# Patient Record
Sex: Male | Born: 1985 | Race: White | Hispanic: No | Marital: Married | Smoking: Never smoker
Health system: Southern US, Community
[De-identification: ages and names within clinical notes are randomized; demographics above are authoritative.]

---

## 2016-02-19 ENCOUNTER — Emergency Department (HOSPITAL_BASED_OUTPATIENT_CLINIC_OR_DEPARTMENT_OTHER): Payer: 59

## 2016-02-19 ENCOUNTER — Encounter (HOSPITAL_BASED_OUTPATIENT_CLINIC_OR_DEPARTMENT_OTHER): Payer: Self-pay | Admitting: Emergency Medicine

## 2016-02-19 ENCOUNTER — Emergency Department (HOSPITAL_BASED_OUTPATIENT_CLINIC_OR_DEPARTMENT_OTHER)
Admission: EM | Admit: 2016-02-19 | Discharge: 2016-02-19 | Disposition: A | Discharge status: Home / Self Care | Payer: 59 | Attending: Emergency Medicine | Admitting: Emergency Medicine

## 2016-02-19 DIAGNOSIS — N201 Calculus of ureter: Secondary | ICD-10-CM | POA: Insufficient documentation

## 2016-02-19 DIAGNOSIS — R109 Unspecified abdominal pain: Secondary | ICD-10-CM | POA: Diagnosis present

## 2016-02-19 DIAGNOSIS — N200 Calculus of kidney: Secondary | ICD-10-CM

## 2016-02-19 LAB — URINE MICROSCOPIC-ADD ON: Squamous Epithelial / LPF: NONE SEEN

## 2016-02-19 LAB — URINALYSIS, ROUTINE W REFLEX MICROSCOPIC
Bilirubin Urine: NEGATIVE
Glucose, UA: NEGATIVE mg/dL
Ketones, ur: NEGATIVE mg/dL
LEUKOCYTES UA: NEGATIVE
NITRITE: NEGATIVE
PROTEIN: 100 mg/dL — AB
Specific Gravity, Urine: 1.023 (ref 1.005–1.030)
pH: 5.5 (ref 5.0–8.0)

## 2016-02-19 LAB — BASIC METABOLIC PANEL
Anion gap: 6 (ref 5–15)
BUN: 21 mg/dL — AB (ref 6–20)
CO2: 26 mmol/L (ref 22–32)
CREATININE: 0.76 mg/dL (ref 0.61–1.24)
Calcium: 9.5 mg/dL (ref 8.9–10.3)
Chloride: 108 mmol/L (ref 101–111)
GFR calc Af Amer: >60 mL/min (ref >60)
Glucose, Bld: 104 mg/dL — ABNORMAL HIGH (ref 65–99)
POTASSIUM: 3.9 mmol/L (ref 3.5–5.1)
SODIUM: 140 mmol/L (ref 135–145)

## 2016-02-19 LAB — CBC
HEMATOCRIT: 44.2 % (ref 39.0–52.0)
Hemoglobin: 15.6 g/dL (ref 13.0–17.0)
MCH: 31.3 pg (ref 26.0–34.0)
MCHC: 35.3 g/dL (ref 30.0–36.0)
MCV: 88.6 fL (ref 78.0–100.0)
PLATELETS: 245 10*3/uL (ref 150–400)
RBC: 4.99 MIL/uL (ref 4.22–5.81)
RDW: 13 % (ref 11.5–15.5)
WBC: 9.1 10*3/uL (ref 4.0–10.5)

## 2016-02-19 MED ORDER — CEPHALEXIN 500 MG PO CAPS: 500.0000 mg | ORAL_CAPSULE | Freq: Two times a day (BID) | ORAL | 0 refills | Status: AC

## 2016-02-19 MED ORDER — HYDROCODONE-ACETAMINOPHEN 5-325 MG PO TABS: 1.0000 | ORAL_TABLET | ORAL | 0 refills | Status: AC | PRN

## 2016-02-19 NOTE — ED Notes (Signed)
Pt transported to CT by technician.

## 2016-02-19 NOTE — ED Triage Notes (Signed)
Pt states burning and discomfort with urination since yesterday, and then awoke with lower left back pain that comes and goes in "excrutiating" waves since.

## 2016-02-19 NOTE — ED Provider Notes (Signed)
a [facility]  [facility]   CSN: [id number] Arrival date & time: 02/19/16  0815     History   Chief Complaint Chief Complaint  Patient presents with  . Back Pain      HPI  Barry Bowen is a 30 y.o. Male  with no significant past medical history present for sudden onset left flank pain this morning. Pain lasted for approximately 30 minutes, with severe nature, was associated with nausea but no emesis. He reports burning with urination that started yesterday. He denies abdominal pain, fevers, penile lesions, purulent drainage from the penis. He is sexually active only with his wife.  Otherwise he denies recent illness, chest pain, SOB, current pain, nausea, emesis, or diarrhea He does note that he drinks up to 8 [employer]eps[employer] sodas per day but has been trying to cut down   History reviewed. No pertinent past medical history.  There are no active problems to display for this patient.   History reviewed. No pertinent surgical history.   Home Medications    Prior to Admission medications   Medication Sig Start Date End Date Taking? Authorizing Provider  cephALEXin (KEFLEX) 500 MG capsule Take 1 capsule (500 mg total) by mouth 2 (two) times daily. 02/19/16   Bonney Aid, MD  HYDROcodone-acetaminophen (NORCO) 5-325 MG tablet Take 1 tablet by mouth every 4 (four) hours as needed for moderate pain. 02/19/16   Bonney Aid, MD    Family History No family history on file.  Social History Social History  Substance Use Topics  . Smoking status: Never Smoker  . Smokeless tobacco: Never Used  . Alcohol use No     Allergies   Review of patient's allergies indicates no known allergies.   Review of Systems Review of Systems  Constitutional: Negative.   HENT: Negative.   Eyes: Negative.   Respiratory: Negative.   Cardiovascular: Negative.   Gastrointestinal: Positive for nausea. Negative for vomiting.  Endocrine: Negative.   Genitourinary: Positive for dysuria  and flank pain. Negative for genital sores and hematuria.  Musculoskeletal:       Left flank pain  Skin: Negative.   Neurological: Negative.      Physical Exam Updated Vital Signs BP 116/86 (BP Location: Right Arm)   Pulse 61   Temp 97.8 F (36.6 C) (Oral)   Resp 16   Ht 5\' 11"  (1.803 m)   Wt 72.6 kg   SpO2 100%   BMI 22.32 kg/m   Physical Exam  Constitutional: He is oriented to person, place, and time. He appears well-developed and well-nourished.  HENT:  Head: Normocephalic and atraumatic.  Eyes: EOM are normal. Pupils are equal, round, and reactive to light.  Neck: Normal range of motion. Neck supple.  Cardiovascular: Normal rate, regular rhythm and normal heart sounds.   No murmur heard. Pulmonary/Chest: Effort normal and breath sounds normal. No respiratory distress.  Abdominal: Soft. Bowel sounds are normal. He exhibits no distension. There is no tenderness.  Musculoskeletal: He exhibits no edema or tenderness.  No flank pain bilaterally  Neurological: He is alert and oriented to person, place, and time.     ED Treatments / Results  Labs (all labs ordered are listed, but only abnormal results are displayed) Labs Reviewed  URINALYSIS, ROUTINE W REFLEX MICROSCOPIC (NOT AT [facility]) - Abnormal; Notable for the following:       Result Value   Color, Urine AMBER (*)    APPearance CLOUDY (*)    Hgb  urine dipstick LARGE (*)    Protein, ur 100 (*)    All other components within normal limits  BASIC METABOLIC PANEL - Abnormal; Notable for the following:    Glucose, Bld 104 (*)    BUN 21 (*)    All other components within normal limits  URINE MICROSCOPIC-ADD ON - Abnormal; Notable for the following:    Bacteria, UA MANY (*)    All other components within normal limits  URINE CULTURE  CBC    EKG  EKG Interpretation None       Radiology Ct Renal Stone Study  Result Date: 02/19/2016 CLINICAL DATA:  Left flank pain and hematuria. Burning with urination.  Symptoms for 1 day. EXAM: CT ABDOMEN AND PELVIS WITHOUT CONTRAST TECHNIQUE: Multidetector CT imaging of the abdomen and pelvis was performed following the standard protocol without IV contrast. COMPARISON:  None. FINDINGS: Lower chest: Lung bases are clear.  Heart normal size. Hepatobiliary: No focal liver abnormality is seen. No gallstones, gallbladder wall thickening, or biliary dilatation. Pancreas: Unremarkable. No pancreatic ductal dilatation or surrounding inflammatory changes. Spleen: Normal in size without focal abnormality. Adrenals/Urinary Tract: No adrenal masses. Mild prominence of the left intrarenal collecting system and left ureter. There is a 2 mm stone in the distal left ureter just above the ureterovesicular junction with associated mild periureteral stranding. No other ureteral stones. No intrarenal stones. No renal masses. Bladder is decompressed but otherwise unremarkable. Stomach/Bowel: Stomach, small bowel and colon are unremarkable. Appendix not definitively seen. No evidence of appendicitis. Vascular/Lymphatic: No significant vascular findings are present. No enlarged abdominal or pelvic lymph nodes. Reproductive: Prostate is unremarkable. Other: No abdominal wall hernia or abnormality. No abdominopelvic ascites. Musculoskeletal: No acute or significant osseous findings. IMPRESSION: 1. 2 mm stone in the distal left ureter leads to mild left renal collecting system in ureteral dilation. 2. No other acute findings. No intrarenal stones. Exam otherwise unremarkable. Electronically Signed   By: Amie Portland M.D.   On: 02/19/2016 09:17    Procedures Procedures (including critical care time)  Medications Ordered in ED Medications - No data to display   Initial Impression / Assessment and Plan / ED Course  I have reviewed the triage vital signs and the nursing notes.  Pertinent labs & imaging results that were available during my care of the patient were reviewed by me and considered  in my medical decision making (see chart for details).  Clinical Course    Uncomplicated ED course  Final Clinical Impressions(s) / ED Diagnoses   Final diagnoses:  Renal stone   30 year old male with no significant past medical history presents with left flank pain. Patient was hemodynamically stable in the emergency department and his pain resolved prior to arriving. On CT evaluation he was noted to have a 2 mm stone. Urinalysis was significant for hemoglobin and bacteria with no leukocytes or nitrites. Urine culture was ordered. Given size of the stone, and his clinical stability the patient was discharged with hydration precautions. He was asked to reduce the amount of soda he has been drinking. He will be continued on keflex x10 days for possible UTI given the bacteria in his urine. Patient counseled to follow with urology if he does not pass the stone the next several days. He will be given Norco 2 if he should have pain when he passes the stone. He was given information for urology to call for follow-up as needed. Patient was discharged from the ED in stable condition. All questions were  answered and patient and his wife expressed their understanding and acceptance of the spine.      New Prescriptions Discharge Medication List as of 02/19/2016  9:58 AM    START taking these medications   Details  cephALEXin (KEFLEX) 500 MG capsule Take 1 capsule (500 mg total) by mouth 2 (two) times daily., Starting Sat 02/19/2016, Print    HYDROcodone-acetaminophen (NORCO) 5-325 MG tablet Take 1 tablet by mouth every 4 (four) hours as needed for moderate pain., Starting Sat 02/19/2016, Print        Kushi Kun A. Kennon RoundsHaney MD, MS Family Medicine Resident PGY-3 Pager [phone]    Bonney AidAlyssa A Reisa Coppola, MD 02/19/16 [phone]    Marily MemosJason Mesner, MD 02/19/16 [phone]

## 2016-02-19 NOTE — Discharge Instructions (Signed)
You have a Kidney stone. Drink plenty of water, reduce the amount of soda that you drink. You may take Norco as needed for pain as the kidney stone passes. Please take antibiotics to ten days as prescribed. If you start to have fevers, worsening pain with urination or do not pass the stone within the next few days, return to the ED or call urolgy.

## 2016-02-20 LAB — URINE CULTURE: CULTURE: NO GROWTH

## 2016-08-27 ENCOUNTER — Emergency Department (HOSPITAL_BASED_OUTPATIENT_CLINIC_OR_DEPARTMENT_OTHER)
Admission: EM | Admit: 2016-08-27 | Discharge: 2016-08-27 | Disposition: A | Discharge status: Home / Self Care | Payer: 59 | Attending: Emergency Medicine | Admitting: Emergency Medicine

## 2016-08-27 ENCOUNTER — Emergency Department (HOSPITAL_BASED_OUTPATIENT_CLINIC_OR_DEPARTMENT_OTHER): Payer: 59

## 2016-08-27 ENCOUNTER — Encounter (HOSPITAL_BASED_OUTPATIENT_CLINIC_OR_DEPARTMENT_OTHER): Payer: Self-pay | Admitting: Emergency Medicine

## 2016-08-27 DIAGNOSIS — R519 Headache, unspecified: Secondary | ICD-10-CM

## 2016-08-27 DIAGNOSIS — H66002 Acute suppurative otitis media without spontaneous rupture of ear drum, left ear: Secondary | ICD-10-CM

## 2016-08-27 DIAGNOSIS — R51 Headache: Secondary | ICD-10-CM | POA: Diagnosis present

## 2016-08-27 MED ORDER — AMOXICILLIN 500 MG PO CAPS: 500.0000 mg | ORAL_CAPSULE | Freq: Three times a day (TID) | ORAL | 0 refills | Status: AC

## 2016-08-27 NOTE — ED Notes (Signed)
Patient reports headache and left ear pain x1 week.   Attempted irrigation of left ear with warm tap water/peroxide as directed by MD. Scant amount of ear discharge noted. Patient c/o nausea and worsening head pain and requested to "take a break".

## 2016-08-27 NOTE — ED Triage Notes (Signed)
Patient reports left sided headache with difficulty hearing x 1 week.  Denies N/V/D, blurred vision, photophobia.  Reports intermittent dizziness upon standing.

## 2016-08-27 NOTE — ED Provider Notes (Signed)
[facility]   CSN: [id number] Arrival date & time: 08/27/16  2014   By signing my name below, I, Teofilo Pod, attest that this documentation has been prepared under the direction and in the presence of Geoffery Lyons, MD . Electronically Signed: Teofilo Pod, ED Scribe. 08/27/2016. 8:53 PM.   History   Chief Complaint Chief Complaint  Patient presents with  . Headache     Headache   This is a new problem. The current episode started yesterday. The problem occurs constantly. The problem has not changed since onset.The pain is located in the left unilateral region. The quality of the pain is described as throbbing. The pain is moderate. The pain does not radiate. Pertinent negatives include no fever, no syncope, no shortness of breath, no nausea and no vomiting. The treatment provided no relief.   HPI Comments:  Barry Bowen is a 31 y.o. male who presents to the Emergency Department complaining of a constant left sided headache x 1 day. He describes the pain as throbbing. Pt complains of associated difficulty hearing from the right ear x 1 week, and dizziness. Pt has taken ibuprofen with no relief. Denies nausea, vomiting, diarrhea, blurred vision, photophobia, ear pain, fever, cough, congestion.    History reviewed. No pertinent past medical history.  There are no active problems to display for this patient.   History reviewed. No pertinent surgical history.     Home Medications    Prior to Admission medications   Medication Sig Start Date End Date Taking? Authorizing Provider  cephALEXin (KEFLEX) 500 MG capsule Take 1 capsule (500 mg total) by mouth 2 (two) times daily. 02/19/16   Bonney Aid, MD  HYDROcodone-acetaminophen (NORCO) 5-325 MG tablet Take 1 tablet by mouth every 4 (four) hours as needed for moderate pain. 02/19/16   Bonney Aid, MD    Family History History reviewed. No pertinent family history.  Social  History Social History  Substance Use Topics  . Smoking status: Never Smoker  . Smokeless tobacco: Never Used  . Alcohol use No     Allergies   Patient has no known allergies.   Review of Systems Review of Systems  Constitutional: Negative for fever.  HENT: Positive for hearing loss. Negative for congestion and ear pain.   Eyes: Negative for photophobia and visual disturbance.  Respiratory: Negative for cough and shortness of breath.   Cardiovascular: Negative for syncope.  Gastrointestinal: Negative for nausea and vomiting.  Neurological: Positive for headaches. Negative for weakness and numbness.  All other systems reviewed and are negative.    Physical Exam Updated Vital Signs BP (!) 129/99 (BP Location: Left Arm)   Pulse 71   Temp 98.5 F (36.9 C) (Oral)   Resp 19   Ht 5\' 11"  (1.803 m)   Wt 160 lb (72.6 kg)   SpO2 100%   BMI 22.32 kg/m   Physical Exam  Constitutional: He is oriented to person, place, and time. He appears well-developed and well-nourished. No distress.  HENT:  Head: Normocephalic and atraumatic.  Mouth/Throat: Oropharynx is clear and moist. No oropharyngeal exudate.  Appears to be a cerumen impaction in the left ear canal. Right TM clear.   Eyes: Conjunctivae and EOM are normal. Pupils are equal, round, and reactive to light.  Neck: Normal range of motion. Neck supple.  No meningismus.  Cardiovascular: Normal rate, regular rhythm, normal heart sounds and intact distal pulses.   No murmur heard. Pulmonary/Chest: Effort normal and  breath sounds normal. No respiratory distress.  Abdominal: Soft. There is no tenderness. There is no rebound and no guarding.  Musculoskeletal: Normal range of motion. He exhibits no edema or tenderness.  Neurological: He is alert and oriented to person, place, and time. No cranial nerve deficit. He exhibits normal muscle tone. Coordination normal.  No ataxia on finger to nose bilaterally. No pronator drift. 5/5  strength throughout. CN 2-12 intact.Equal grip strength. Sensation intact.   Skin: Skin is warm.  Psychiatric: He has a normal mood and affect. His behavior is normal.  Nursing note and vitals reviewed.    ED Treatments / Results  DIAGNOSTIC STUDIES:  Oxygen Saturation is 100% on RA, normal by my interpretation.    COORDINATION OF CARE:  8:48 PM Will irrigate left ear. Discussed treatment plan with pt at bedside and pt agreed to plan.   Labs (all labs ordered are listed, but only abnormal results are displayed) Labs Reviewed - No data to display  EKG  EKG Interpretation None       Radiology No results found.  Procedures Procedures (including critical care time)  Medications Ordered in ED Medications - No data to display   Initial Impression / Assessment and Plan / ED Course  I have reviewed the triage vital signs and the nursing notes.  Pertinent labs & imaging results that were available during my care of the patient were reviewed by me and considered in my medical decision making (see chart for details).  Patient presents here with complaints of headache and hearing loss in the right ear. On initial exam, there was a cerumen impaction obscuring view of the TM. This was irrigated with peroxide and tap water. The patient tolerated this poorly. A second look reveals dislodgment of cerumen and what appears to be an otitis media.  Head CT was performed and is unremarkable. She will be given antibiotics for what appears to be an otitis media and advised to follow-up as needed for any problems.  Final Clinical Impressions(s) / ED Diagnoses   Final diagnoses:  None    New Prescriptions New Prescriptions   No medications on file  I personally performed the services described in this documentation, which was scribed in my presence. The recorded information has been reviewed and is accurate.        Geoffery Lyonsouglas Ranessa Kosta, MD 08/27/16 [phone]

## 2016-08-27 NOTE — ED Notes (Signed)
Left ear irrigated for approx 10 minutes. Pt c/o dizziness during the procedure and and was given several breaks. Small flecks of wax noted. Able to visualize eardrum. Pt states he can hear a little better.

## 2016-08-27 NOTE — Discharge Instructions (Signed)
Amoxicillin as prescribed.  Ibuprofen 600 mg every six hours as needed for pain.  Return to the emergency department if symptoms significantly worsen or change.

## 2016-08-27 NOTE — ED Notes (Signed)
ED Provider at bedside. 

## 2018-01-09 DIAGNOSIS — L255 Unspecified contact dermatitis due to plants, except food: Secondary | ICD-10-CM | POA: Diagnosis not present

## 2018-01-18 DIAGNOSIS — L0390 Cellulitis, unspecified: Secondary | ICD-10-CM | POA: Diagnosis not present

## 2018-03-24 DIAGNOSIS — J019 Acute sinusitis, unspecified: Secondary | ICD-10-CM | POA: Diagnosis not present

## 2018-03-27 IMAGING — CT CT RENAL STONE PROTOCOL
2 of 4 series · 17 of 46 positions shown, 19 images · non-contrast
Comparison: None.

CLINICAL DATA: Left flank pain and hematuria. Burning with
urination. Symptoms for 1 day.

EXAM:
CT ABDOMEN AND PELVIS WITHOUT CONTRAST
TECHNIQUE: Multidetector CT imaging of the abdomen and pelvis was performed
following the standard protocol without IV contrast.

[Series 2: axial st · axial · 0.85mm/px · z∈[-422,-17]mm · 14 of 89 slices shown, 16 images]
[im 4/89  soft-tissue]
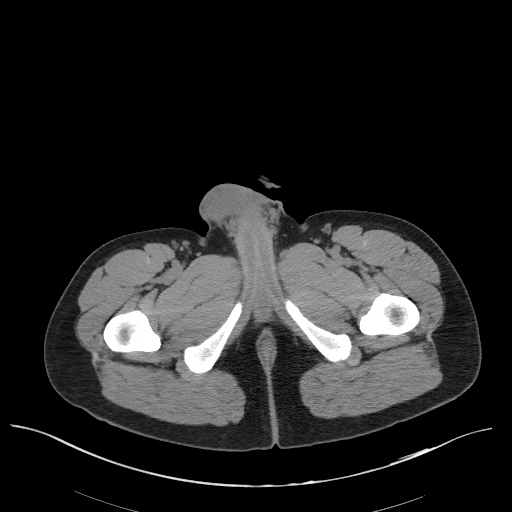
[im 4/89  bone]
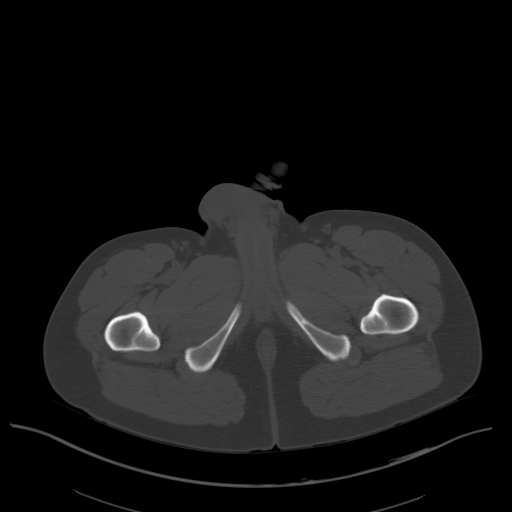
[im 11/89  soft-tissue]
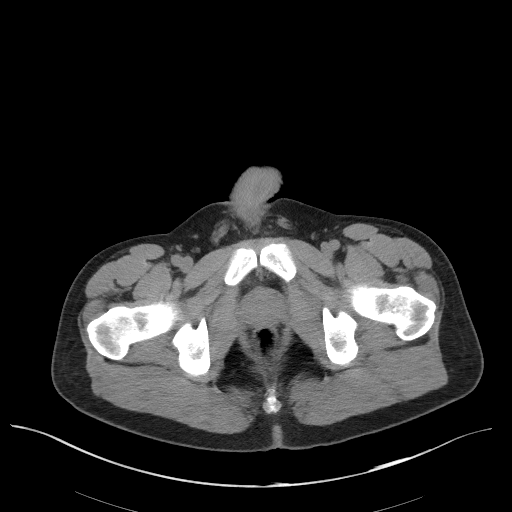
[im 18/89  soft-tissue]
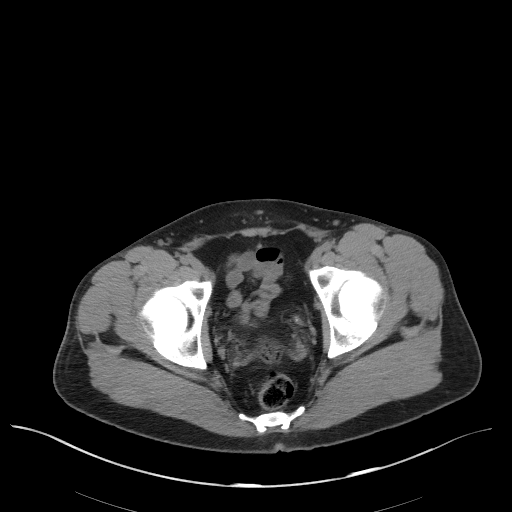
[im 25/89  soft-tissue]
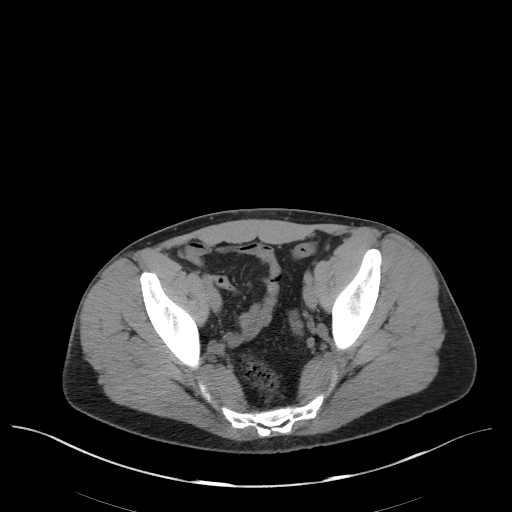
[im 29/89  soft-tissue]
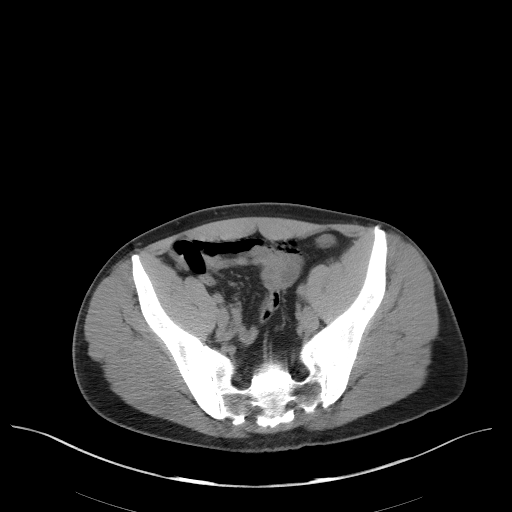
[im 36/89  soft-tissue]
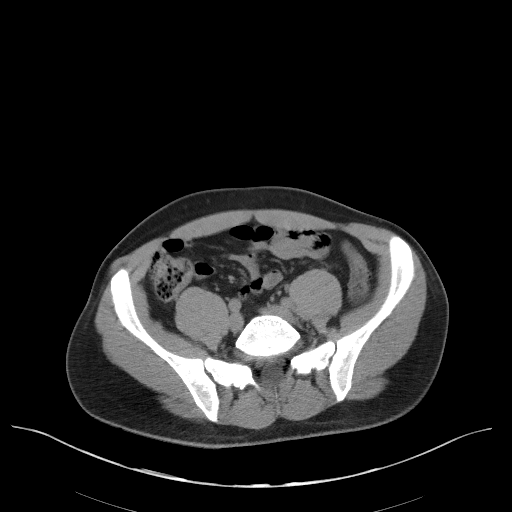
[im 43/89  soft-tissue]
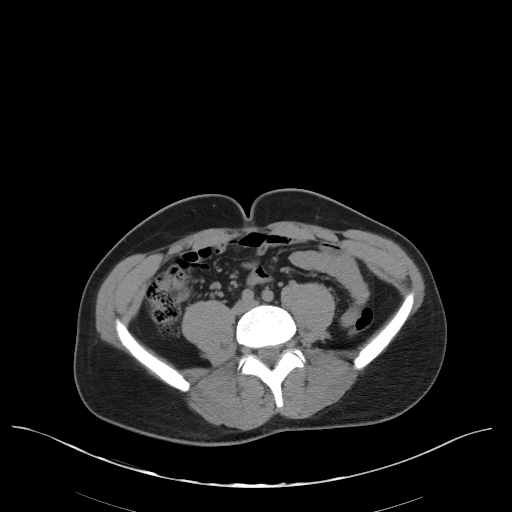
[im 46/89  soft-tissue]
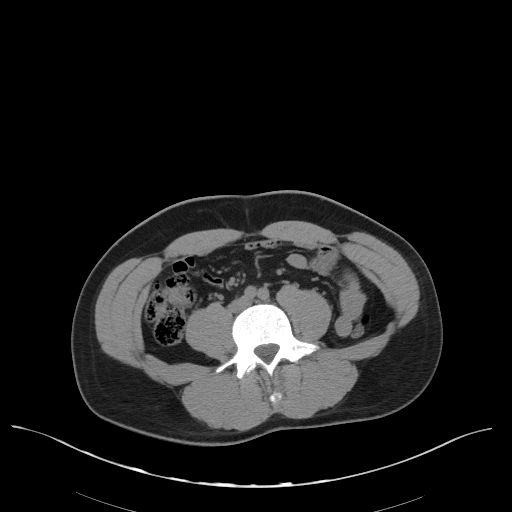
[im 53/89  soft-tissue]
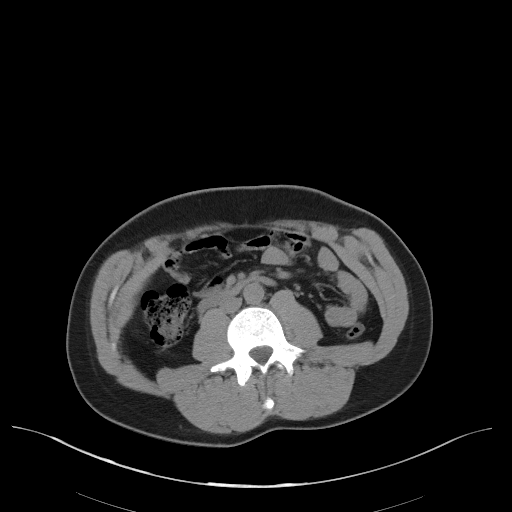
[im 53/89  bone]
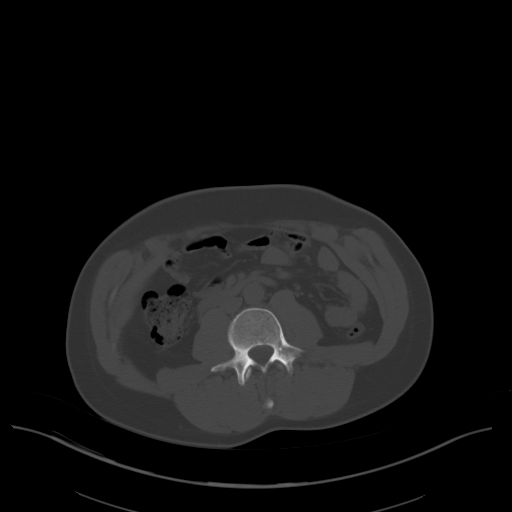
[im 60/89  soft-tissue]
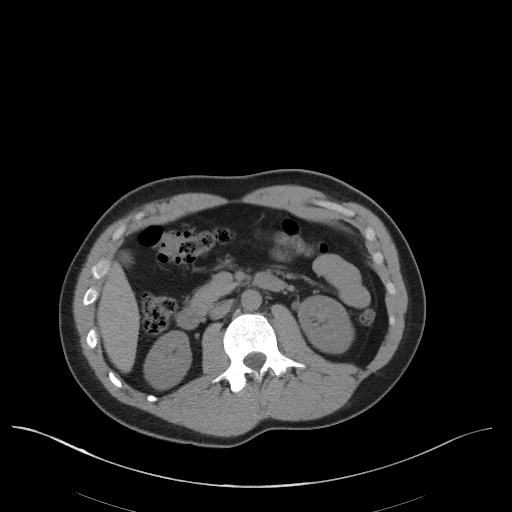
[im 67/89  soft-tissue]
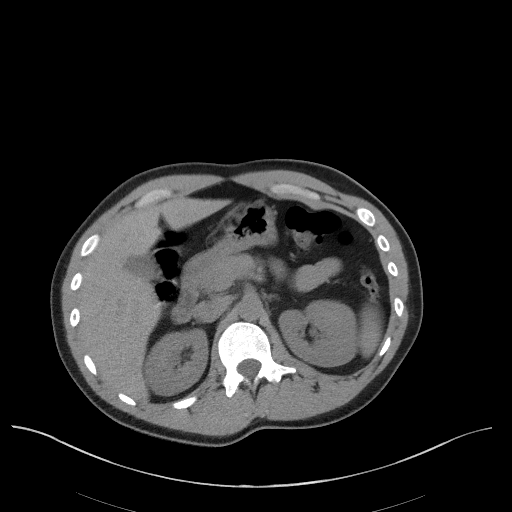
[im 71/89  soft-tissue]
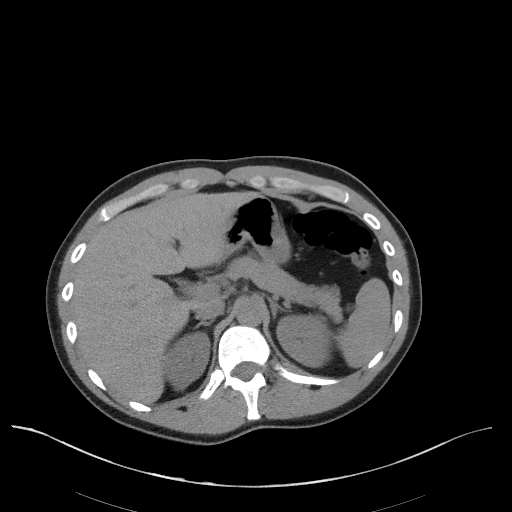
[im 78/89  soft-tissue]
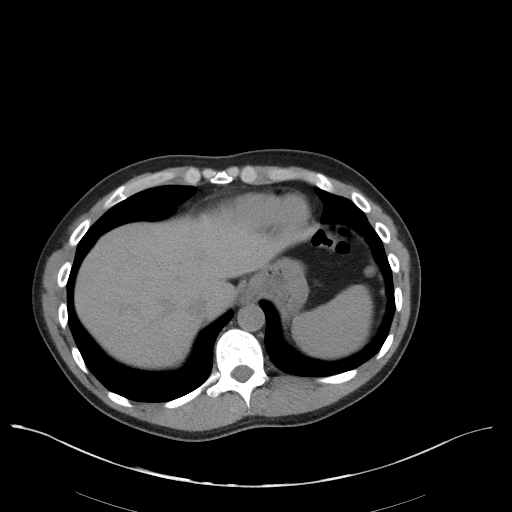
[im 85/89  soft-tissue]
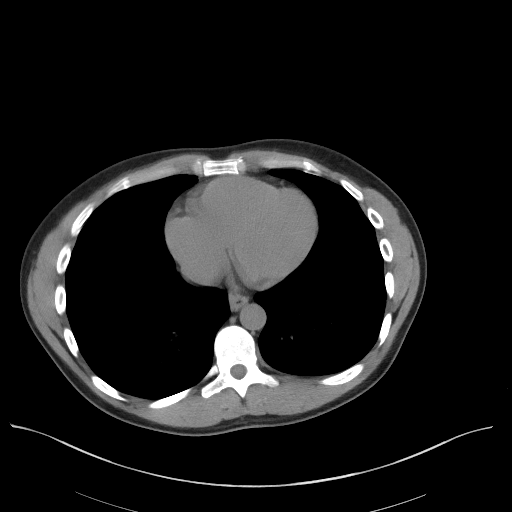

[Series 5: coronal st · coronal · 0.87mm/px · 3 of 80 slices shown]
[im 27/80  soft-tissue]
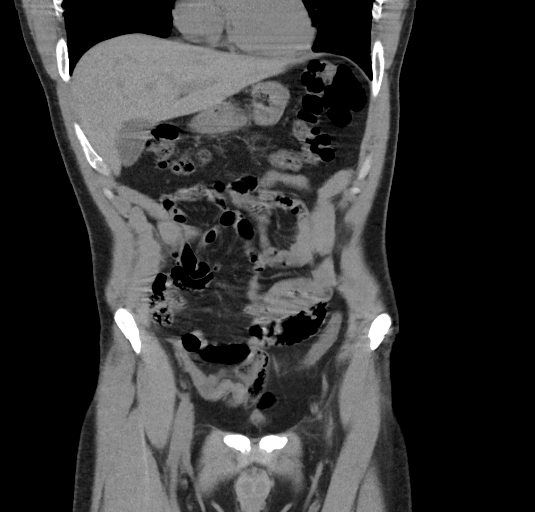
[im 36/80  soft-tissue]
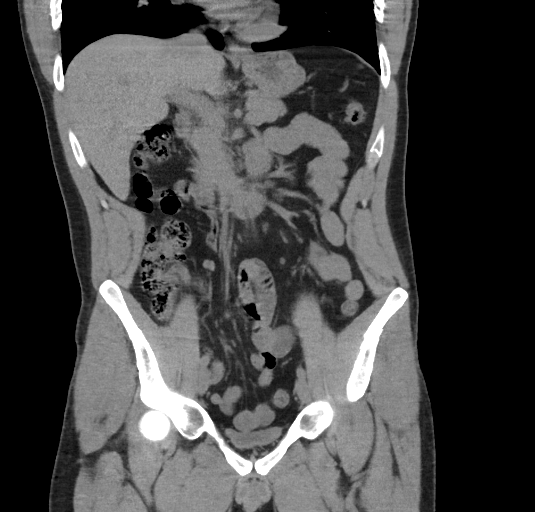
[im 44/80  soft-tissue]
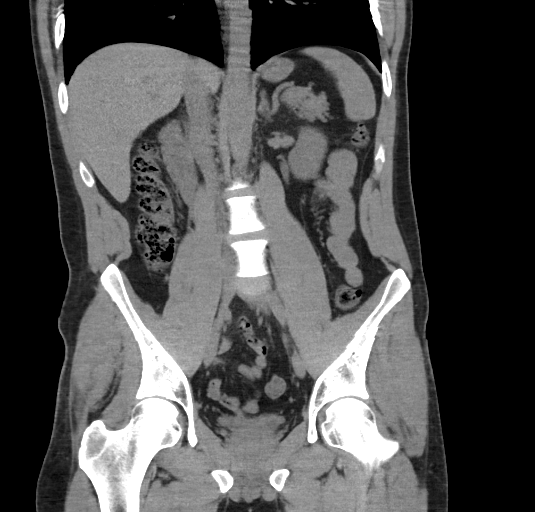

[17 of 46 positions shown; findings below may reference images not displayed]

FINDINGS: Lower chest: Lung bases are clear.  Heart normal size.

Hepatobiliary: No focal liver abnormality is seen. No gallstones,
gallbladder wall thickening, or biliary dilatation.

Pancreas: Unremarkable. No pancreatic ductal dilatation or
surrounding inflammatory changes.

Spleen: Normal in size without focal abnormality.

Adrenals/Urinary Tract: No adrenal masses. Mild prominence of the
left intrarenal collecting system and left ureter. There is a 2 mm
stone in the distal left ureter just above the ureterovesicular
junction with associated mild periureteral stranding. No other
ureteral stones. No intrarenal stones. No renal masses. Bladder is
decompressed but otherwise unremarkable.

Stomach/Bowel: Stomach, small bowel and colon are unremarkable.
Appendix not definitively seen. No evidence of appendicitis.

Vascular/Lymphatic: No significant vascular findings are present. No
enlarged abdominal or pelvic lymph nodes.

Reproductive: Prostate is unremarkable.

Other: No abdominal wall hernia or abnormality. No abdominopelvic
ascites.

Musculoskeletal: No acute or significant osseous findings.
IMPRESSION: 1. 2 mm stone in the distal left ureter leads to mild left renal
collecting system in ureteral dilation.
2. No other acute findings. No intrarenal stones. Exam otherwise
unremarkable.

## 2018-10-03 IMAGING — CT CT HEAD W/O CM
3 series · 15 of 47 positions shown, 18 images · non-contrast
Comparison: None.

CLINICAL DATA: Acute onset of left-sided headache and difficulty
hearing. Initial encounter.

EXAM:
CT HEAD WITHOUT CONTRAST
TECHNIQUE: Contiguous axial images were obtained from the base of the skull
through the vertex without intravenous contrast.

[Series 2: head wo · axial · 0.46mm/px · z∈[+1398,+1522]mm · 9 of 31 slices shown, 12 images]
[im 3/31  brain]
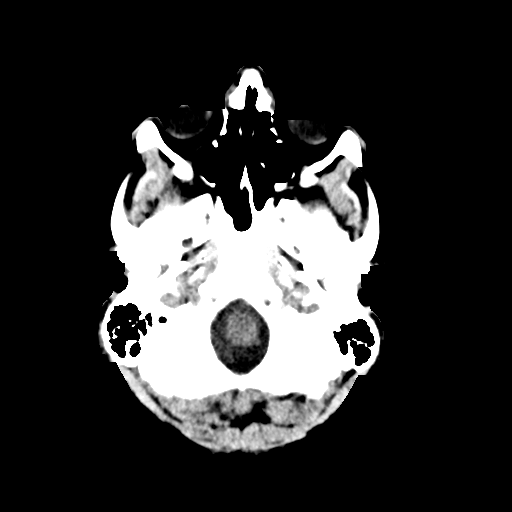
[im 3/31  bone]
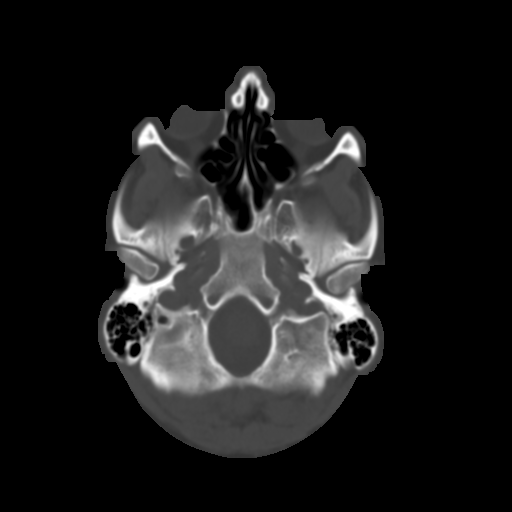
[im 6/31  brain]
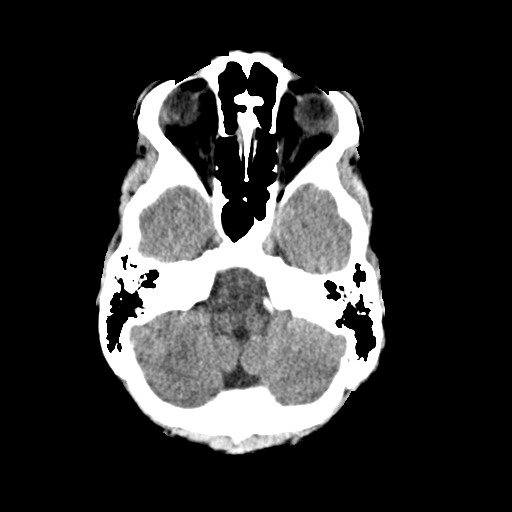
[im 9/31  brain]
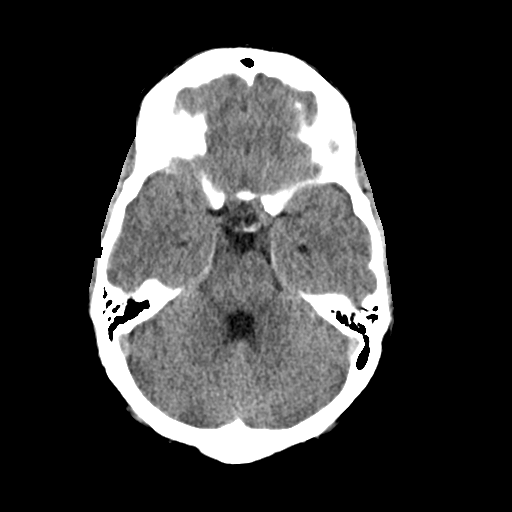
[im 12/31  brain]
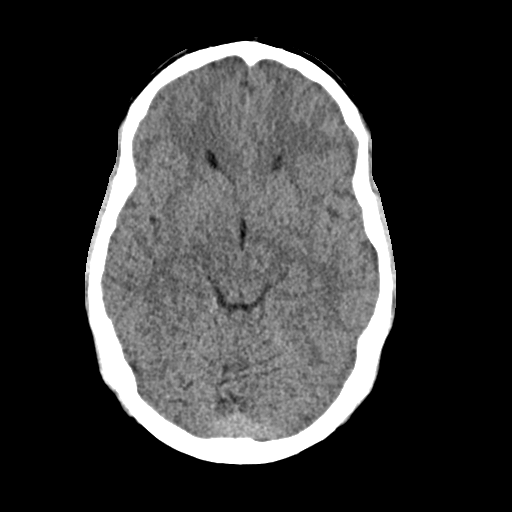
[im 16/31  brain]
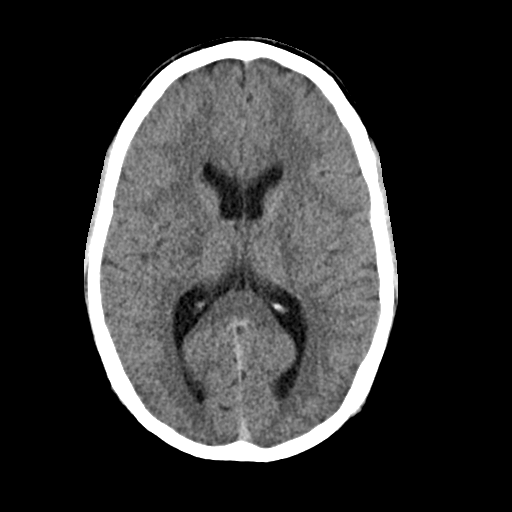
[im 16/31  bone]
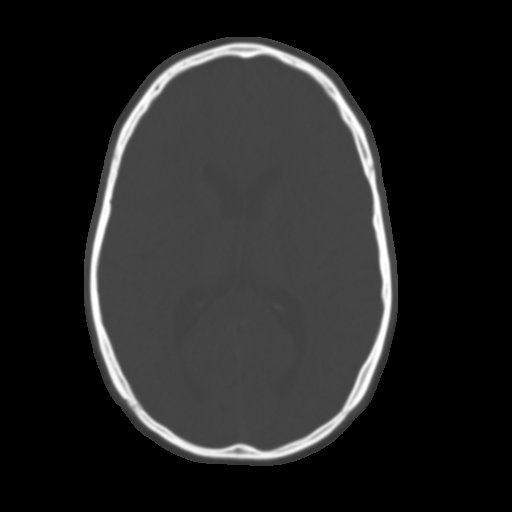
[im 19/31  brain]
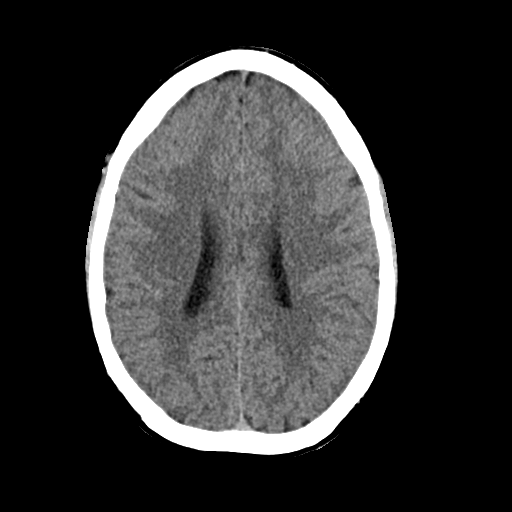
[im 22/31  brain]
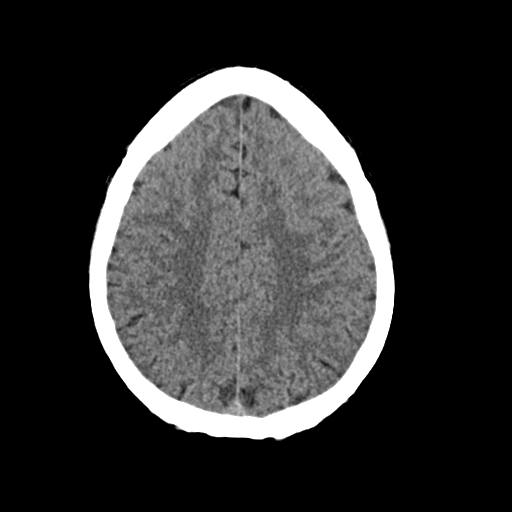
[im 25/31  brain]
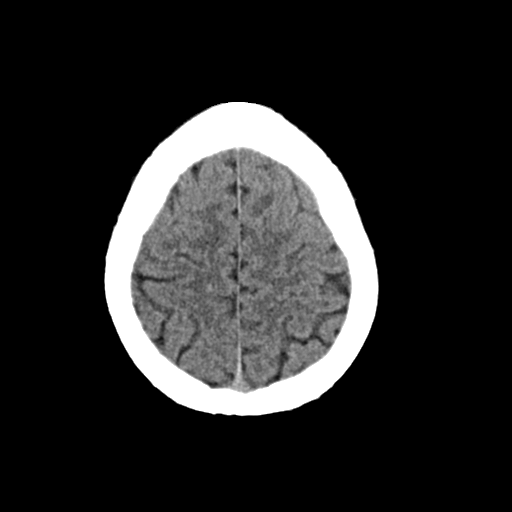
[im 28/31  brain]
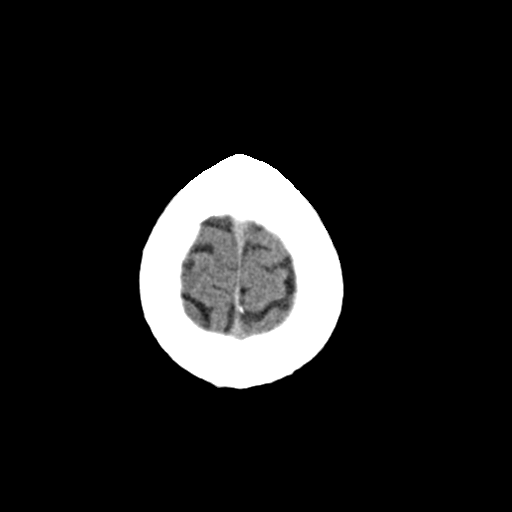
[im 28/31  bone]
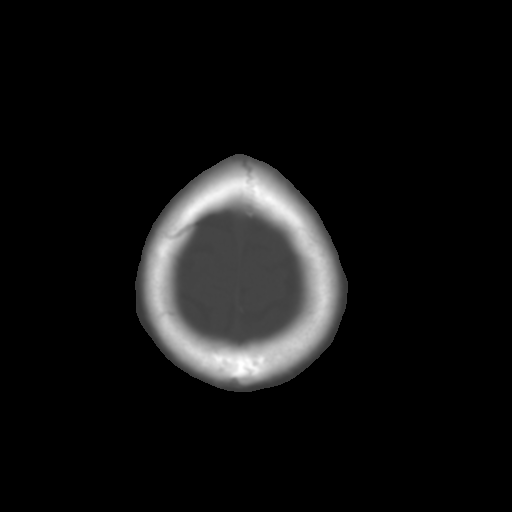

[Series 4: coronal soft · coronal · 0.30mm/px · 3 of 73 slices shown]
[im 25/73  brain]
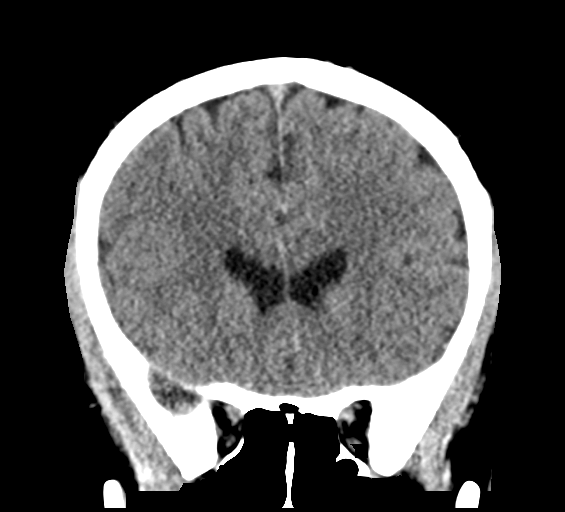
[im 33/73  brain]
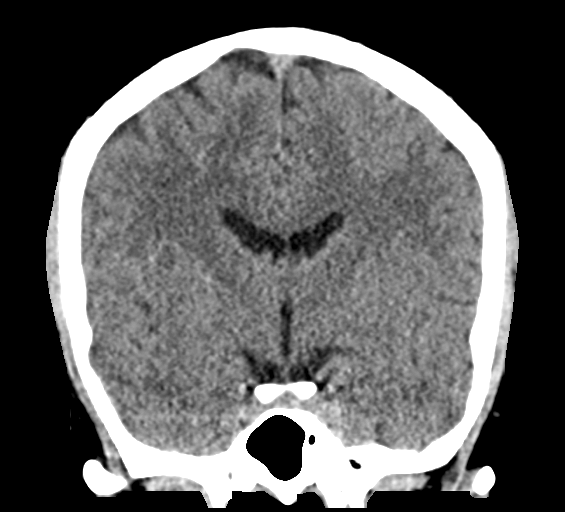
[im 41/73  brain]
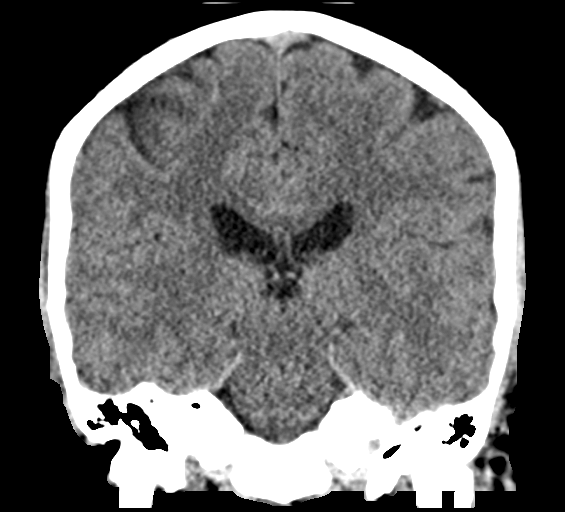

[Series 5: sag soft · sagittal · 0.30mm/px · 3 of 59 slices shown]
[im 20/59  brain]
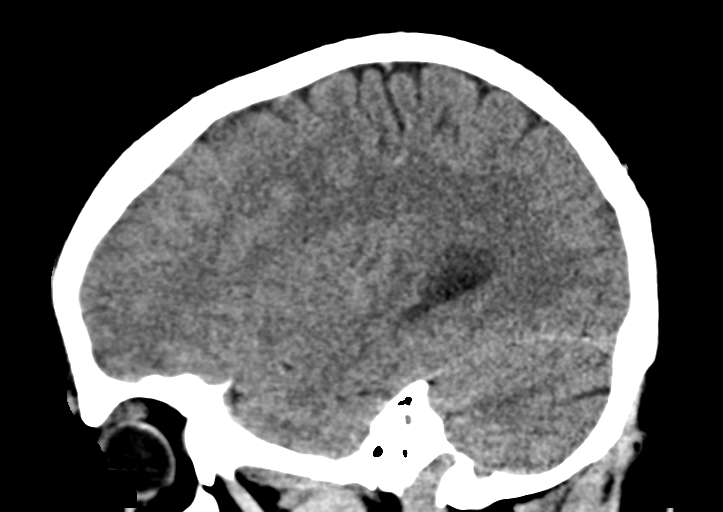
[im 30/59  brain]
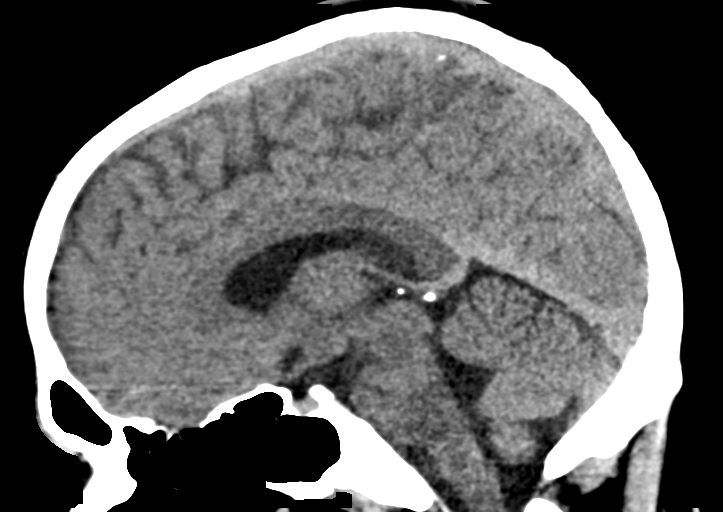
[im 39/59  brain]
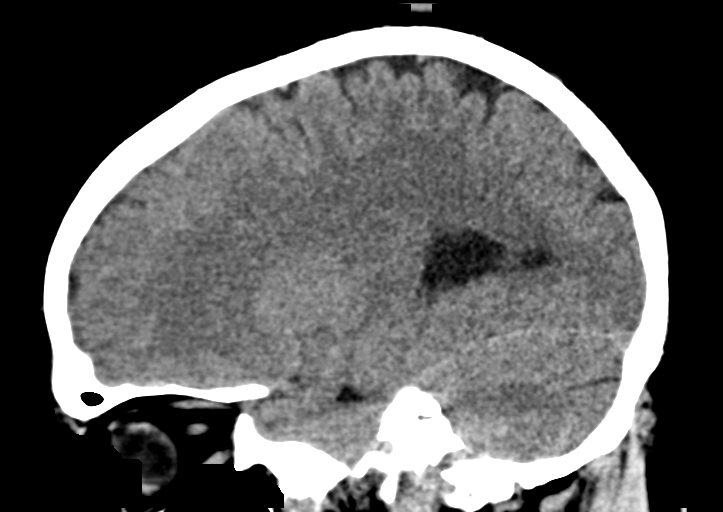

[15 of 47 positions shown; findings below may reference images not displayed]

FINDINGS: Brain: No evidence of acute infarction, hemorrhage, hydrocephalus,
extra-axial collection or mass lesion/mass effect.

The posterior fossa, including the cerebellum, brainstem and fourth
ventricle, is within normal limits. The third and lateral
ventricles, and basal ganglia are unremarkable in appearance. The
cerebral hemispheres are symmetric in appearance, with normal
gray-white differentiation. No mass effect or midline shift is seen.

Vascular: No hyperdense vessel or unexpected calcification.

Skull: There is no evidence of fracture; visualized osseous
structures are unremarkable in appearance.

Sinuses/Orbits: The visualized portions of the orbits are within
normal limits. The paranasal sinuses and mastoid air cells are
well-aerated.

Other: No significant soft tissue abnormalities are seen.
IMPRESSION: Unremarkable noncontrast CT of the head.
